# Patient Record
Sex: Male | Born: 1985 | Race: White | Hispanic: No | Marital: Single
Health system: Southern US, Community
[De-identification: ages and names within clinical notes are randomized; demographics above are authoritative.]

---

## 2007-01-27 ENCOUNTER — Emergency Department (HOSPITAL_COMMUNITY): Admission: EM | Admit: 2007-01-27 | Discharge: 2007-01-27 | Payer: Self-pay | Admitting: Emergency Medicine

## 2010-04-13 ENCOUNTER — Emergency Department (HOSPITAL_COMMUNITY): Admission: EM | Admit: 2010-04-13 | Discharge: 2010-04-13 | Payer: Self-pay | Admitting: Emergency Medicine

## 2021-07-14 ENCOUNTER — Emergency Department (HOSPITAL_COMMUNITY): Payer: Self-pay

## 2021-07-14 ENCOUNTER — Emergency Department (HOSPITAL_COMMUNITY)
Admission: EM | Admit: 2021-07-14 | Discharge: 2021-07-14 | Disposition: A | Discharge status: Home / Self Care | Payer: Self-pay | Attending: Emergency Medicine | Admitting: Emergency Medicine

## 2021-07-14 ENCOUNTER — Other Ambulatory Visit: Payer: Self-pay

## 2021-07-14 ENCOUNTER — Encounter (HOSPITAL_COMMUNITY): Payer: Self-pay

## 2021-07-14 DIAGNOSIS — R569 Unspecified convulsions: Secondary | ICD-10-CM | POA: Insufficient documentation

## 2021-07-14 DIAGNOSIS — S0121XA Laceration without foreign body of nose, initial encounter: Secondary | ICD-10-CM | POA: Insufficient documentation

## 2021-07-14 DIAGNOSIS — R519 Headache, unspecified: Secondary | ICD-10-CM | POA: Insufficient documentation

## 2021-07-14 DIAGNOSIS — S0081XA Abrasion of other part of head, initial encounter: Secondary | ICD-10-CM

## 2021-07-14 DIAGNOSIS — W01198A Fall on same level from slipping, tripping and stumbling with subsequent striking against other object, initial encounter: Secondary | ICD-10-CM | POA: Insufficient documentation

## 2021-07-14 LAB — BASIC METABOLIC PANEL
Anion gap: 8 (ref 5–15)
BUN: 14 mg/dL (ref 6–20)
CO2: 28 mmol/L (ref 22–32)
Calcium: 9.2 mg/dL (ref 8.9–10.3)
Chloride: 104 mmol/L (ref 98–111)
Creatinine, Ser: 0.98 mg/dL (ref 0.61–1.24)
GFR, Estimated: >60 mL/min (ref >60)
Glucose, Bld: 122 mg/dL — ABNORMAL HIGH (ref 70–99)
Potassium: 3.5 mmol/L (ref 3.5–5.1)
Sodium: 140 mmol/L (ref 135–145)

## 2021-07-14 LAB — CBC WITH DIFFERENTIAL/PLATELET
Abs Immature Granulocytes: 0.01 10*3/uL (ref 0.00–0.07)
Basophils Absolute: 0.1 10*3/uL (ref 0.0–0.1)
Basophils Relative: 1 %
Eosinophils Absolute: 0.1 10*3/uL (ref 0.0–0.5)
Eosinophils Relative: 1 %
HCT: 41.7 % (ref 39.0–52.0)
Hemoglobin: 14.2 g/dL (ref 13.0–17.0)
Immature Granulocytes: 0 %
Lymphocytes Relative: 22 %
Lymphs Abs: 1.4 10*3/uL (ref 0.7–4.0)
MCH: 30.6 pg (ref 26.0–34.0)
MCHC: 34.1 g/dL (ref 30.0–36.0)
MCV: 89.9 fL (ref 80.0–100.0)
Monocytes Absolute: 0.4 10*3/uL (ref 0.1–1.0)
Monocytes Relative: 7 %
Neutro Abs: 4.3 10*3/uL (ref 1.7–7.7)
Neutrophils Relative %: 69 %
Platelets: 171 10*3/uL (ref 150–400)
RBC: 4.64 MIL/uL (ref 4.22–5.81)
RDW: 13.5 % (ref 11.5–15.5)
WBC: 6.3 10*3/uL (ref 4.0–10.5)
nRBC: 0 % (ref 0.0–0.2)

## 2021-07-14 LAB — CBG MONITORING, ED: Glucose-Capillary: 127 mg/dL — ABNORMAL HIGH (ref 70–99)

## 2021-07-14 MED ORDER — ACETAMINOPHEN 325 MG PO TABS
650.0000 mg | ORAL_TABLET | Freq: Once | ORAL | Status: AC
  Administered 2021-07-14: 650 mg via ORAL
  Filled 2021-07-14: qty 2

## 2021-07-14 NOTE — ED Provider Notes (Signed)
[facility]-EMERGENCY DEPT Provider Note   CSN: [id number] Arrival date & time: 07/14/21  1308     History Chief Complaint  Patient presents with   Seizures    Earl Love is a 35 y.o. male.  He has no significant past medical history.  He is coming in for an episode of possible seizure.  Wife said he was normal when he woke up.  She heard him fall and found him with his legs stiffened, upper body clonic activity bilaterally, foaming at the mouth and unresponsive.  She thought this lasted about a minute.  He was confused and agitated afterwards.  EMS found patient to be alert and oriented x2 on arrival.  She said he is back to baseline now.  He is complaining of mild headache.  No prior seizure activity.  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  Admits to marijuana.  Social alcohol.  No recent head injuries.  The history is provided by the patient and the spouse.  Seizures Seizure activity on arrival: no   Seizure type:  Grand mal Episode characteristics: abnormal movements, generalized shaking, stiffening and unresponsiveness   Postictal symptoms: confusion   Return to baseline: yes   Duration:  1 minute Timing:  Once Progression:  Resolved Context: not alcohol withdrawal, not sleeping less, not fever, not hydrocephalus, not intracranial lesion and not intracranial shunt   Recent head injury:  No recent head injuries PTA treatment:  None History of seizures: no       History reviewed. No pertinent past medical history.  There are no problems to display for this patient.   History reviewed. No pertinent surgical history.     History reviewed. No pertinent family history.     Home Medications Prior to Admission medications   Not on File    Allergies    Patient has no known allergies.  Review of Systems   Review of Systems  Constitutional:  Negative for fever.  HENT:  Negative for sore throat.   Eyes:  Negative for visual  disturbance.  Respiratory:  Negative for shortness of breath.   Cardiovascular:  Negative for chest pain.  Gastrointestinal:  Negative for abdominal pain.  Genitourinary:  Negative for dysuria.  Musculoskeletal:  Negative for neck pain.  Skin:  Positive for wound. Negative for rash.  Neurological:  Positive for seizures.   Physical Exam Updated Vital Signs BP 128/86 (BP Location: Left Arm)   Pulse 66   Temp 97.8 F (36.6 C) (Oral)   Resp 18   Ht 6' (1.829 m)   Wt 77.6 kg   SpO2 100%   BMI 23.19 kg/m   Physical Exam Vitals and nursing note reviewed.  Constitutional:      Appearance: Normal appearance. He is well-developed.  HENT:     Head: Normocephalic.     Ears:     Comments: 0.5 cm nasal laceration.  Abrasions over left cheek. Eyes:     Conjunctiva/sclera: Conjunctivae normal.  Cardiovascular:     Rate and Rhythm: Normal rate and regular rhythm.     Heart sounds: No murmur heard. Pulmonary:     Effort: Pulmonary effort is normal. No respiratory distress.     Breath sounds: Normal breath sounds.  Abdominal:     Palpations: Abdomen is soft.     Tenderness: There is no abdominal tenderness.  Musculoskeletal:        General: No deformity or signs of injury. Normal range of motion.  Cervical back: Neck supple.  Skin:    General: Skin is warm and dry.  Neurological:     General: No focal deficit present.     Mental Status: He is alert and oriented to person, place, and time.     Cranial Nerves: No cranial nerve deficit.     Sensory: No sensory deficit.     Motor: No weakness.    ED Results / Procedures / Treatments   Labs (all labs ordered are listed, but only abnormal results are displayed) Labs Reviewed  BASIC METABOLIC PANEL - Abnormal; Notable for the following components:      Result Value   Glucose, Bld 122 (*)    All other components within normal limits  CBG MONITORING, ED - Abnormal; Notable for the following components:   Glucose-Capillary 127  (*)    All other components within normal limits  CBC WITH DIFFERENTIAL/PLATELET    EKG None  Radiology CT HEAD WO CONTRAST  Result Date: 07/14/2021 CLINICAL DATA:  Nontraumatic seizure. EXAM: CT HEAD WITHOUT CONTRAST TECHNIQUE: Contiguous axial images were obtained from the base of the skull through the vertex without intravenous contrast. COMPARISON:  None. FINDINGS: Brain: No evidence of acute infarction, hemorrhage, hydrocephalus, extra-axial collection or mass lesion/mass effect. Vascular: No hyperdense vessel or unexpected calcification. Skull: Normal. Negative for fracture or focal lesion. Sinuses/Orbits: No acute finding. IMPRESSION: Negative head CT. Electronically Signed   By: Marnee Spring M.D.   On: 07/14/2021 08:52    Procedures .Marland KitchenLaceration Repair  Date/Time: 07/14/2021 6:52 PM Performed by: Terrilee Files, MD Authorized by: Terrilee Files, MD   Consent:    Consent obtained:  Verbal   Consent given by:  Patient   Risks discussed:  Infection, pain, poor cosmetic result, poor wound healing and retained foreign body   Alternatives discussed:  No treatment and delayed treatment Universal protocol:    Procedure explained and questions answered to patient or proxy's satisfaction: yes     Patient identity confirmed:  Verbally with patient Laceration details:    Location:  Face   Face location:  Nose   Length (cm):  0.5 Treatment:    Area cleansed with:  Saline   Debridement:  None Skin repair:    Repair method:  Tissue adhesive Approximation:    Approximation:  Close Post-procedure details:    Dressing:  Open (no dressing)   Procedure completion:  Tolerated well, no immediate complications   Medications Ordered in ED Medications - No data to display  ED Course  I have reviewed the triage vital signs and the nursing notes.  Pertinent labs & imaging results that were available during my care of the patient were reviewed by me and considered in my medical  decision making (see chart for details).    MDM Rules/Calculators/A&P                          This patient complains of possible seizure versus syncope; this involves an extensive number of treatment Options and is a complaint that carries with it a high risk of complications and Morbidity. The differential includes seizure, syncope, anemia, metabolic derangement, arrhythmia  I ordered, reviewed and interpreted labs, which included CBC with normal white count normal hemoglobin, chemistries normal I ordered imaging studies which included head CT and I independently    visualized and interpreted imaging which showed no acute findings Additional history obtained from patient's wife Previous records obtained and reviewed in epic  no recent admissions  After the interventions stated above, I reevaluated the patient and found patient to be clinically asymptomatic.  Labs benign.  CT imaging does not show any acute findings.  Likely first-time seizure.  Counseled on restrictions.  Placed referral for outpatient neurology follow-up.  Return instructions discussed   Final Clinical Impression(s) / ED Diagnoses Final diagnoses:  Seizure (HCC)  Laceration of nose, initial encounter  Abrasion of face, initial encounter    Rx / DC Orders ED Discharge Orders     None        Terrilee Files, MD 07/14/21 [phone]

## 2021-07-14 NOTE — Discharge Instructions (Addendum)
You were seen in the emergency department for evaluation of a seizure episode.  You had lab work and a CAT scan of your head that did not show any significant abnormalities.  We have placed a referral into neurology for you.  In the meantime you should not operate a motor vehicle or engage in other activities where having another seizure might put yourself or others at risk.  Please return to the Emergency Department if any worsening or concerning symptoms

## 2021-07-14 NOTE — ED Triage Notes (Signed)
Per EMS, family reports full body shaking and patient fell to floor and hit head on indoor trampoline with seizure like activity x61min. No hx of seizures, +loc. Denies BT.A&Ox2 on ems arrival. A&O x4 on ER arrival. Pt doesn't recall even. Laceration to nose.

## 2022-01-16 IMAGING — CT CT HEAD W/O CM
3 series · 16 of 47 positions shown, 19 images · non-contrast
Comparison: None.

CLINICAL DATA: Nontraumatic seizure.

EXAM:
CT HEAD WITHOUT CONTRAST
TECHNIQUE: Contiguous axial images were obtained from the base of the skull
through the vertex without intravenous contrast.

[Series 2: head wo · axial · 0.47mm/px · z∈[-75,+55]mm · 10 of 32 slices shown, 13 images]
[im 3/32  brain]
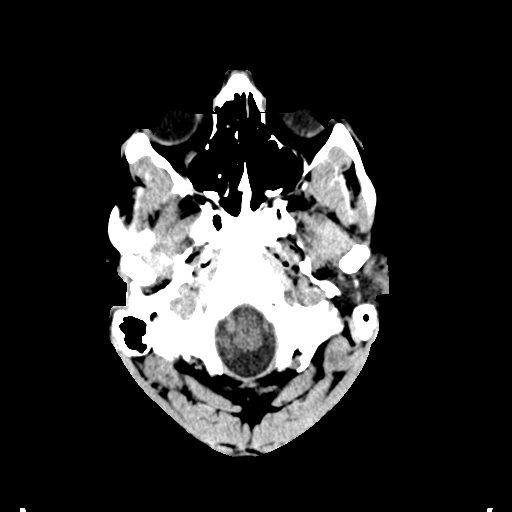
[im 3/32  bone]
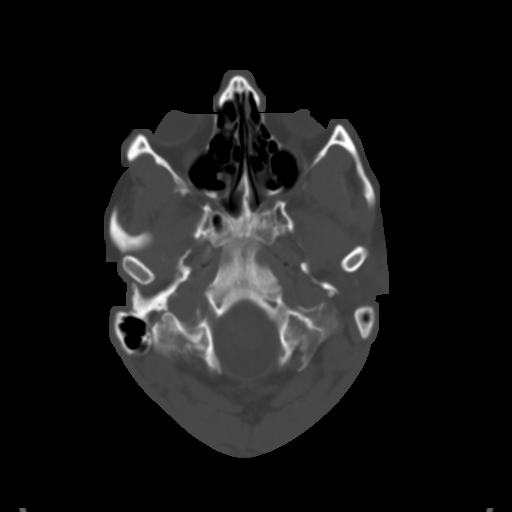
[im 6/32  brain]
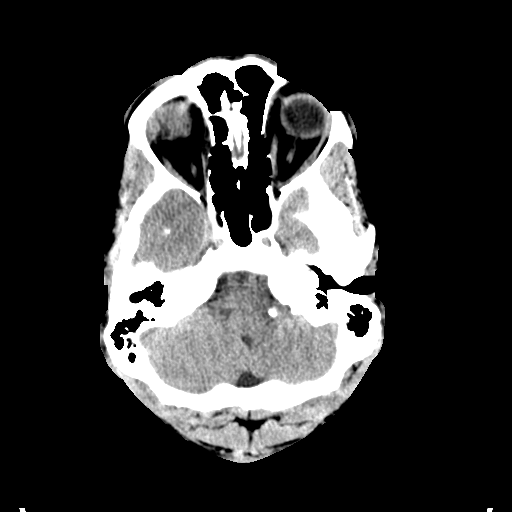
[im 9/32  brain]
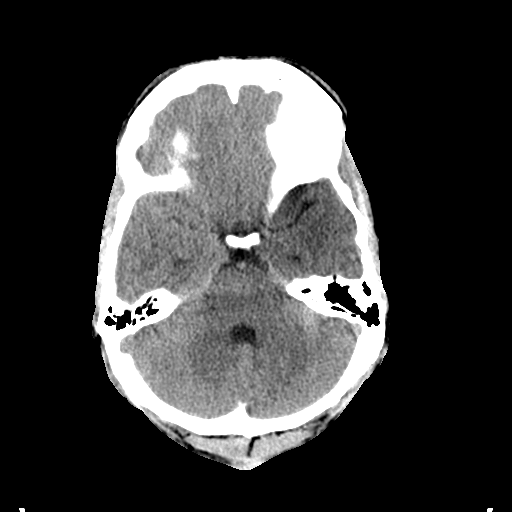
[im 11/32  brain]
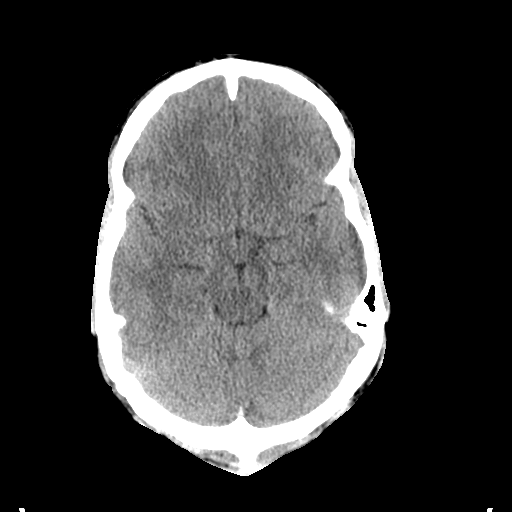
[im 14/32  brain]
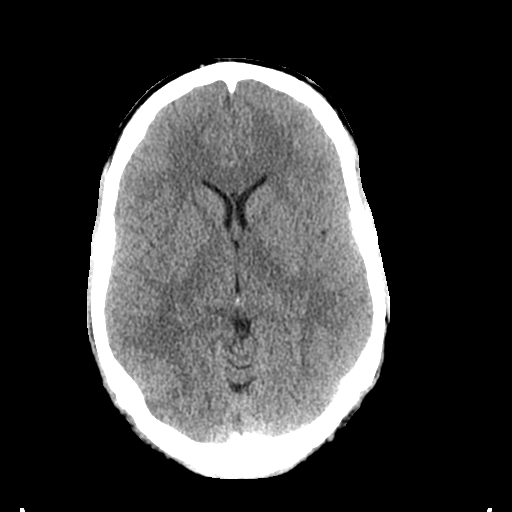
[im 14/32  bone]
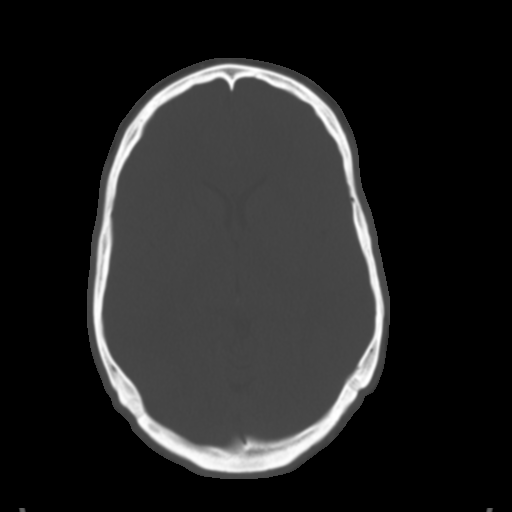
[im 18/32  brain]
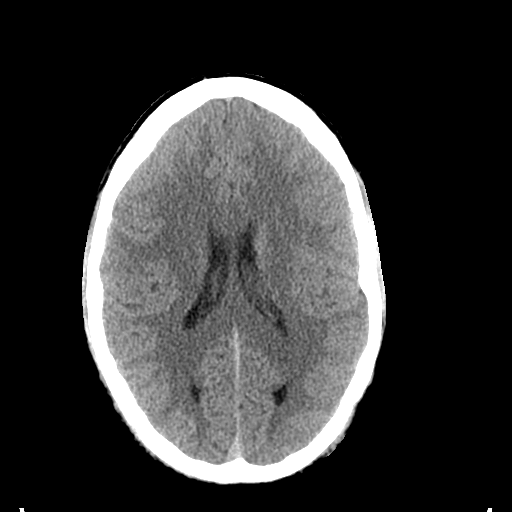
[im 21/32  brain]
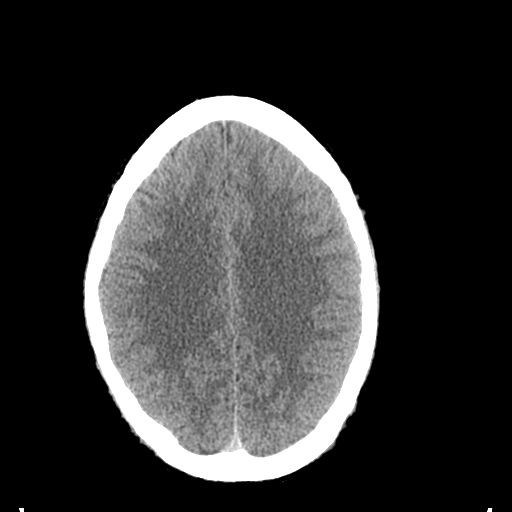
[im 24/32  brain]
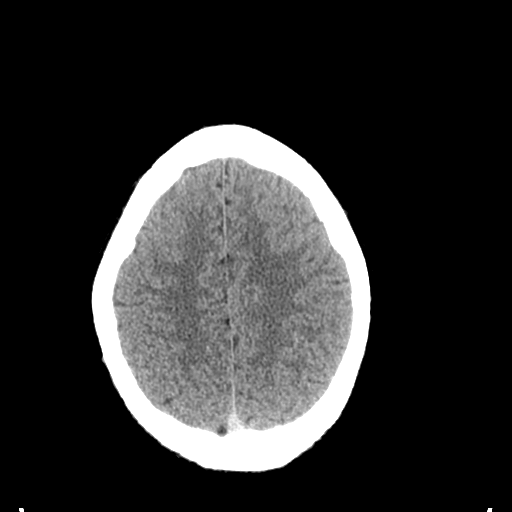
[im 26/32  brain]
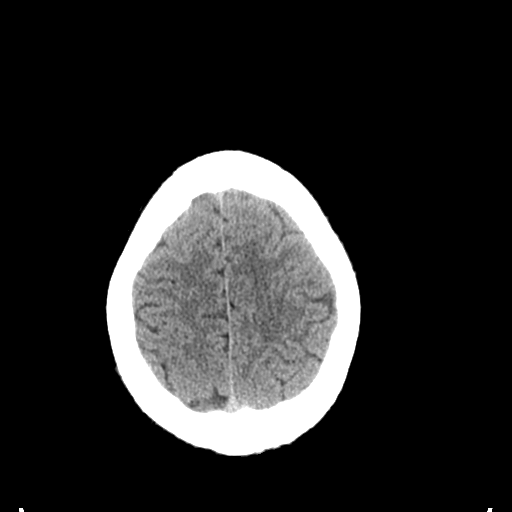
[im 26/32  bone]
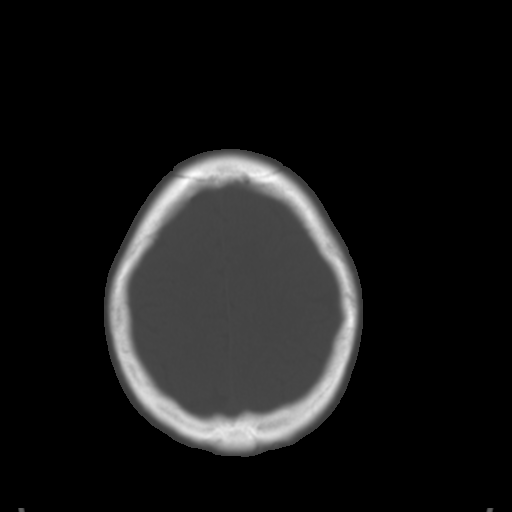
[im 29/32  brain]
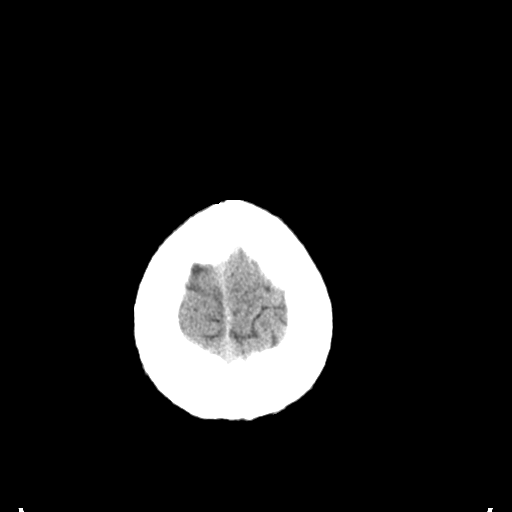

[Series 4: coronal soft tissue · coronal · 0.29mm/px · 3 of 69 slices shown]
[im 23/69  brain]
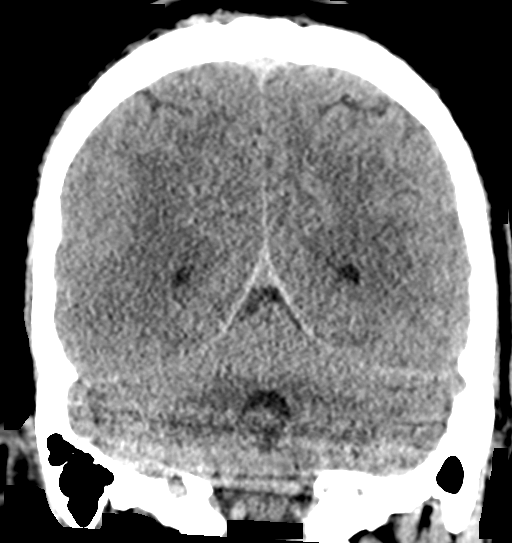
[im 31/69  brain]
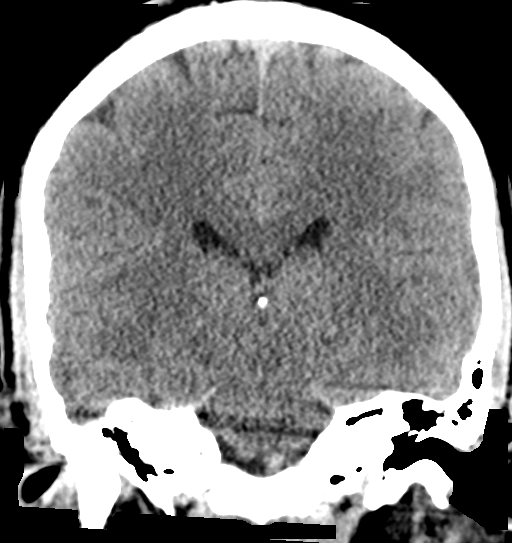
[im 38/69  brain]
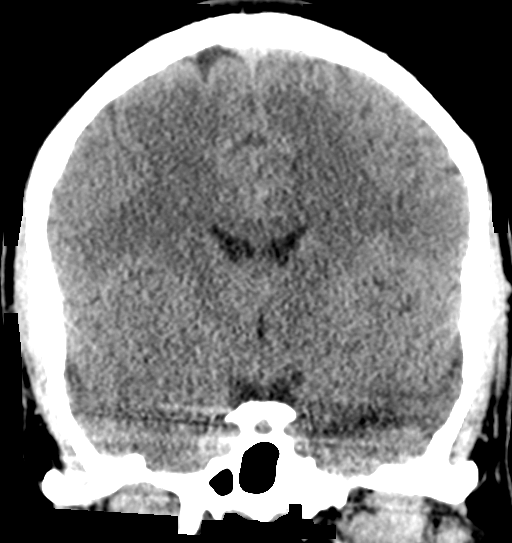

[Series 5: sagittal soft tissue · sagittal · 0.31mm/px · 3 of 50 slices shown]
[im 17/50  brain]
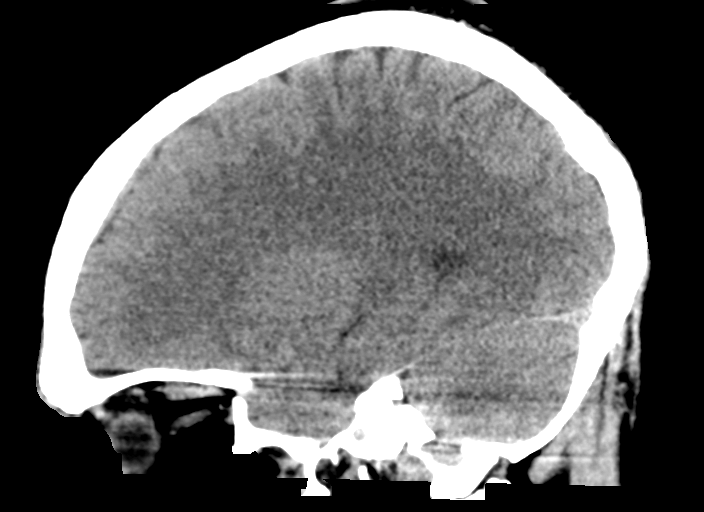
[im 25/50  brain]
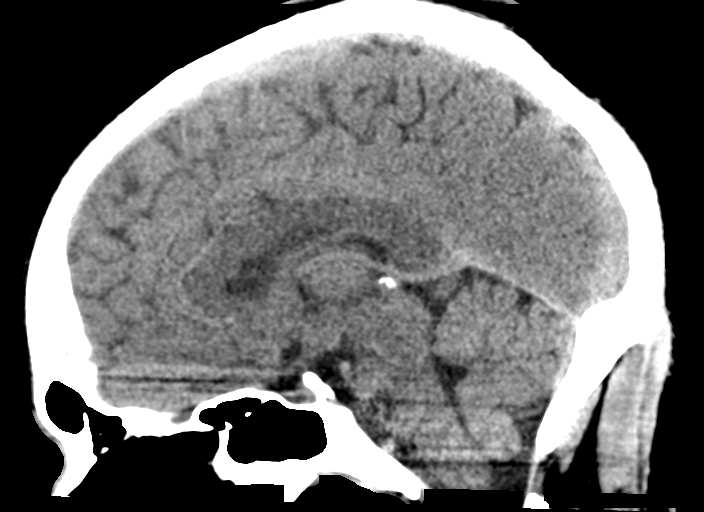
[im 33/50  brain]
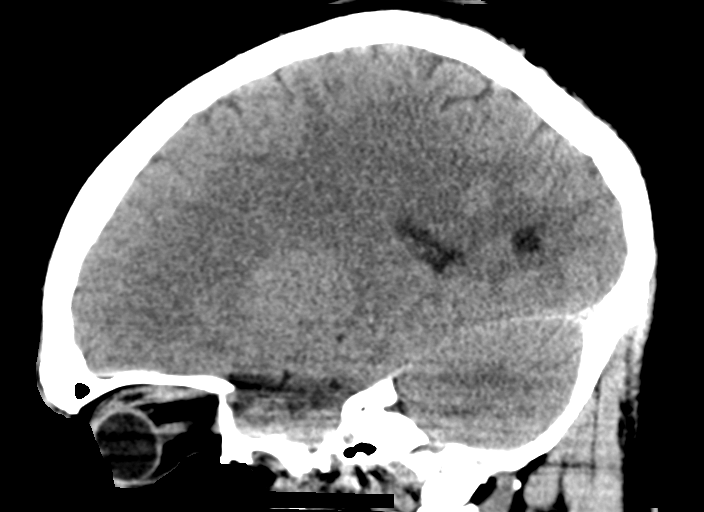

[16 of 47 positions shown; findings below may reference images not displayed]

FINDINGS: Brain: No evidence of acute infarction, hemorrhage, hydrocephalus,
extra-axial collection or mass lesion/mass effect.

Vascular: No hyperdense vessel or unexpected calcification.

Skull: Normal. Negative for fracture or focal lesion.

Sinuses/Orbits: No acute finding.
IMPRESSION: Negative head CT.

## 2022-10-12 DIAGNOSIS — S45102A Unspecified injury of brachial artery, left side, initial encounter: Secondary | ICD-10-CM | POA: Diagnosis not present

## 2022-10-12 DIAGNOSIS — S01352A Open bite of left ear, initial encounter: Secondary | ICD-10-CM | POA: Diagnosis not present

## 2022-10-12 DIAGNOSIS — Y999 Unspecified external cause status: Secondary | ICD-10-CM | POA: Diagnosis not present

## 2022-10-12 DIAGNOSIS — S01551A Open bite of lip, initial encounter: Secondary | ICD-10-CM | POA: Diagnosis not present

## 2022-10-12 DIAGNOSIS — S01312A Laceration without foreign body of left ear, initial encounter: Secondary | ICD-10-CM | POA: Diagnosis not present

## 2022-10-12 DIAGNOSIS — Z23 Encounter for immunization: Secondary | ICD-10-CM | POA: Diagnosis not present

## 2022-10-12 DIAGNOSIS — Z043 Encounter for examination and observation following other accident: Secondary | ICD-10-CM | POA: Diagnosis not present

## 2022-10-12 DIAGNOSIS — T07XXXA Unspecified multiple injuries, initial encounter: Secondary | ICD-10-CM | POA: Diagnosis not present

## 2022-10-12 DIAGNOSIS — I70298 Other atherosclerosis of native arteries of extremities, other extremity: Secondary | ICD-10-CM | POA: Diagnosis not present

## 2022-10-12 DIAGNOSIS — G4089 Other seizures: Secondary | ICD-10-CM | POA: Diagnosis not present

## 2022-10-12 DIAGNOSIS — W540XXA Bitten by dog, initial encounter: Secondary | ICD-10-CM | POA: Diagnosis not present

## 2022-10-12 DIAGNOSIS — R58 Hemorrhage, not elsewhere classified: Secondary | ICD-10-CM | POA: Diagnosis not present

## 2022-10-12 DIAGNOSIS — S41112A Laceration without foreign body of left upper arm, initial encounter: Secondary | ICD-10-CM | POA: Diagnosis not present

## 2022-10-12 DIAGNOSIS — R569 Unspecified convulsions: Secondary | ICD-10-CM | POA: Diagnosis not present

## 2022-10-12 DIAGNOSIS — I742 Embolism and thrombosis of arteries of the upper extremities: Secondary | ICD-10-CM | POA: Diagnosis not present

## 2022-10-12 DIAGNOSIS — S0181XA Laceration without foreign body of other part of head, initial encounter: Secondary | ICD-10-CM | POA: Diagnosis not present

## 2022-10-12 DIAGNOSIS — S01511A Laceration without foreign body of lip, initial encounter: Secondary | ICD-10-CM | POA: Diagnosis not present

## 2022-10-12 DIAGNOSIS — G40309 Generalized idiopathic epilepsy and epileptic syndromes, not intractable, without status epilepticus: Secondary | ICD-10-CM | POA: Diagnosis not present

## 2022-10-13 DIAGNOSIS — W540XXA Bitten by dog, initial encounter: Secondary | ICD-10-CM | POA: Diagnosis not present

## 2022-10-13 DIAGNOSIS — G4089 Other seizures: Secondary | ICD-10-CM | POA: Diagnosis not present

## 2022-10-13 DIAGNOSIS — D62 Acute posthemorrhagic anemia: Secondary | ICD-10-CM | POA: Diagnosis not present

## 2022-10-13 DIAGNOSIS — I742 Embolism and thrombosis of arteries of the upper extremities: Secondary | ICD-10-CM | POA: Diagnosis not present

## 2022-10-13 DIAGNOSIS — S01352A Open bite of left ear, initial encounter: Secondary | ICD-10-CM | POA: Diagnosis not present

## 2022-10-13 DIAGNOSIS — S0181XA Laceration without foreign body of other part of head, initial encounter: Secondary | ICD-10-CM | POA: Diagnosis not present

## 2022-10-13 DIAGNOSIS — S01551A Open bite of lip, initial encounter: Secondary | ICD-10-CM | POA: Diagnosis not present

## 2022-10-14 DIAGNOSIS — I742 Embolism and thrombosis of arteries of the upper extremities: Secondary | ICD-10-CM | POA: Diagnosis not present

## 2022-10-14 DIAGNOSIS — D62 Acute posthemorrhagic anemia: Secondary | ICD-10-CM | POA: Diagnosis not present

## 2022-10-14 DIAGNOSIS — S0181XA Laceration without foreign body of other part of head, initial encounter: Secondary | ICD-10-CM | POA: Diagnosis not present

## 2022-10-14 DIAGNOSIS — S01511A Laceration without foreign body of lip, initial encounter: Secondary | ICD-10-CM | POA: Diagnosis not present

## 2022-10-14 DIAGNOSIS — G4089 Other seizures: Secondary | ICD-10-CM | POA: Diagnosis not present

## 2022-10-14 DIAGNOSIS — W540XXD Bitten by dog, subsequent encounter: Secondary | ICD-10-CM | POA: Diagnosis not present

## 2022-11-19 DIAGNOSIS — R569 Unspecified convulsions: Secondary | ICD-10-CM | POA: Diagnosis not present

## 2022-11-19 DIAGNOSIS — Z0389 Encounter for observation for other suspected diseases and conditions ruled out: Secondary | ICD-10-CM | POA: Diagnosis not present

## 2022-11-19 DIAGNOSIS — I959 Hypotension, unspecified: Secondary | ICD-10-CM | POA: Diagnosis not present

## 2022-11-19 DIAGNOSIS — Z743 Need for continuous supervision: Secondary | ICD-10-CM | POA: Diagnosis not present

## 2022-11-19 DIAGNOSIS — Y92410 Unspecified street and highway as the place of occurrence of the external cause: Secondary | ICD-10-CM | POA: Diagnosis not present

## 2022-11-19 DIAGNOSIS — Z041 Encounter for examination and observation following transport accident: Secondary | ICD-10-CM | POA: Diagnosis not present

## 2022-11-19 DIAGNOSIS — G40909 Epilepsy, unspecified, not intractable, without status epilepticus: Secondary | ICD-10-CM | POA: Diagnosis not present

## 2022-11-19 DIAGNOSIS — I4510 Unspecified right bundle-branch block: Secondary | ICD-10-CM | POA: Diagnosis not present

## 2022-11-19 DIAGNOSIS — Y999 Unspecified external cause status: Secondary | ICD-10-CM | POA: Diagnosis not present

## 2022-11-19 DIAGNOSIS — R0689 Other abnormalities of breathing: Secondary | ICD-10-CM | POA: Diagnosis not present

## 2022-11-29 DIAGNOSIS — G40109 Localization-related (focal) (partial) symptomatic epilepsy and epileptic syndromes with simple partial seizures, not intractable, without status epilepticus: Secondary | ICD-10-CM | POA: Diagnosis not present

## 2022-12-16 DIAGNOSIS — R0902 Hypoxemia: Secondary | ICD-10-CM | POA: Diagnosis not present

## 2022-12-16 DIAGNOSIS — Z743 Need for continuous supervision: Secondary | ICD-10-CM | POA: Diagnosis not present

## 2022-12-16 DIAGNOSIS — W010XXA Fall on same level from slipping, tripping and stumbling without subsequent striking against object, initial encounter: Secondary | ICD-10-CM | POA: Diagnosis not present

## 2022-12-16 DIAGNOSIS — G40909 Epilepsy, unspecified, not intractable, without status epilepticus: Secondary | ICD-10-CM | POA: Diagnosis not present

## 2022-12-16 DIAGNOSIS — R55 Syncope and collapse: Secondary | ICD-10-CM | POA: Diagnosis not present

## 2022-12-16 DIAGNOSIS — I959 Hypotension, unspecified: Secondary | ICD-10-CM | POA: Diagnosis not present

## 2022-12-16 DIAGNOSIS — Y999 Unspecified external cause status: Secondary | ICD-10-CM | POA: Diagnosis not present

## 2022-12-16 DIAGNOSIS — R569 Unspecified convulsions: Secondary | ICD-10-CM | POA: Diagnosis not present

## 2022-12-16 DIAGNOSIS — S0121XA Laceration without foreign body of nose, initial encounter: Secondary | ICD-10-CM | POA: Diagnosis not present

## 2022-12-17 DIAGNOSIS — R55 Syncope and collapse: Secondary | ICD-10-CM | POA: Diagnosis not present

## 2022-12-27 DIAGNOSIS — G40109 Localization-related (focal) (partial) symptomatic epilepsy and epileptic syndromes with simple partial seizures, not intractable, without status epilepticus: Secondary | ICD-10-CM | POA: Diagnosis not present

## 2023-01-28 DIAGNOSIS — R569 Unspecified convulsions: Secondary | ICD-10-CM | POA: Diagnosis not present

## 2023-01-28 DIAGNOSIS — G40409 Other generalized epilepsy and epileptic syndromes, not intractable, without status epilepticus: Secondary | ICD-10-CM | POA: Diagnosis not present

## 2023-01-28 DIAGNOSIS — F419 Anxiety disorder, unspecified: Secondary | ICD-10-CM | POA: Diagnosis not present

## 2023-01-28 DIAGNOSIS — F17210 Nicotine dependence, cigarettes, uncomplicated: Secondary | ICD-10-CM | POA: Diagnosis not present

## 2023-01-28 DIAGNOSIS — Z791 Long term (current) use of non-steroidal anti-inflammatories (NSAID): Secondary | ICD-10-CM | POA: Diagnosis not present

## 2023-01-28 DIAGNOSIS — Z79899 Other long term (current) drug therapy: Secondary | ICD-10-CM | POA: Diagnosis not present

## 2023-01-28 DIAGNOSIS — F1210 Cannabis abuse, uncomplicated: Secondary | ICD-10-CM | POA: Diagnosis not present

## 2023-01-28 DIAGNOSIS — G40909 Epilepsy, unspecified, not intractable, without status epilepticus: Secondary | ICD-10-CM | POA: Diagnosis not present

## 2023-01-29 DIAGNOSIS — G40109 Localization-related (focal) (partial) symptomatic epilepsy and epileptic syndromes with simple partial seizures, not intractable, without status epilepticus: Secondary | ICD-10-CM | POA: Diagnosis not present

## 2023-01-29 DIAGNOSIS — R9401 Abnormal electroencephalogram [EEG]: Secondary | ICD-10-CM | POA: Diagnosis not present

## 2023-01-29 DIAGNOSIS — G9389 Other specified disorders of brain: Secondary | ICD-10-CM | POA: Diagnosis not present

## 2023-01-29 DIAGNOSIS — R569 Unspecified convulsions: Secondary | ICD-10-CM | POA: Diagnosis not present

## 2023-01-30 DIAGNOSIS — G40909 Epilepsy, unspecified, not intractable, without status epilepticus: Secondary | ICD-10-CM | POA: Diagnosis not present

## 2023-03-14 DIAGNOSIS — Z79899 Other long term (current) drug therapy: Secondary | ICD-10-CM | POA: Diagnosis not present

## 2023-03-14 DIAGNOSIS — Z13220 Encounter for screening for lipoid disorders: Secondary | ICD-10-CM | POA: Diagnosis not present

## 2023-03-26 DIAGNOSIS — K529 Noninfective gastroenteritis and colitis, unspecified: Secondary | ICD-10-CM | POA: Diagnosis not present

## 2023-07-18 DIAGNOSIS — R569 Unspecified convulsions: Secondary | ICD-10-CM | POA: Diagnosis not present

## 2023-07-23 DIAGNOSIS — Z743 Need for continuous supervision: Secondary | ICD-10-CM | POA: Diagnosis not present

## 2023-07-23 DIAGNOSIS — G40409 Other generalized epilepsy and epileptic syndromes, not intractable, without status epilepticus: Secondary | ICD-10-CM | POA: Diagnosis not present

## 2023-07-23 DIAGNOSIS — R569 Unspecified convulsions: Secondary | ICD-10-CM | POA: Diagnosis not present

## 2023-07-23 DIAGNOSIS — Z79899 Other long term (current) drug therapy: Secondary | ICD-10-CM | POA: Diagnosis not present

## 2023-07-23 DIAGNOSIS — I959 Hypotension, unspecified: Secondary | ICD-10-CM | POA: Diagnosis not present

## 2023-07-23 DIAGNOSIS — G40909 Epilepsy, unspecified, not intractable, without status epilepticus: Secondary | ICD-10-CM | POA: Diagnosis not present

## 2023-07-23 DIAGNOSIS — R519 Headache, unspecified: Secondary | ICD-10-CM | POA: Diagnosis not present

## 2023-07-23 DIAGNOSIS — R410 Disorientation, unspecified: Secondary | ICD-10-CM | POA: Diagnosis not present

## 2023-07-24 DIAGNOSIS — R569 Unspecified convulsions: Secondary | ICD-10-CM | POA: Diagnosis not present

## 2023-09-12 DIAGNOSIS — R569 Unspecified convulsions: Secondary | ICD-10-CM | POA: Diagnosis not present

## 2023-09-25 DIAGNOSIS — E785 Hyperlipidemia, unspecified: Secondary | ICD-10-CM | POA: Diagnosis not present
